# Patient Record
Sex: Female | Born: 1935 | Race: White | Hispanic: No
Health system: Southern US, Community
[De-identification: ages and names within clinical notes are randomized; demographics above are authoritative.]

---

## 2007-07-24 ENCOUNTER — Ambulatory Visit: Payer: Self-pay | Admitting: Internal Medicine

## 2008-03-12 ENCOUNTER — Emergency Department: Payer: Self-pay | Admitting: Emergency Medicine

## 2009-08-16 ENCOUNTER — Emergency Department: Payer: Self-pay | Admitting: Internal Medicine

## 2009-12-10 ENCOUNTER — Emergency Department: Payer: Self-pay | Admitting: Unknown Physician Specialty

## 2010-09-29 ENCOUNTER — Inpatient Hospital Stay: Payer: Self-pay | Admitting: Internal Medicine

## 2011-08-06 ENCOUNTER — Inpatient Hospital Stay: Payer: Self-pay | Admitting: Internal Medicine

## 2011-08-06 LAB — URINALYSIS, COMPLETE
Nitrite: NEGATIVE
Ph: 7 (ref 4.5–8.0)
Protein: 30
Specific Gravity: 1.011 (ref 1.003–1.030)
Squamous Epithelial: 1

## 2011-08-06 LAB — COMPREHENSIVE METABOLIC PANEL
Albumin: 3.5 g/dL (ref 3.4–5.0)
Anion Gap: 12 (ref 7–16)
Bilirubin,Total: 0.4 mg/dL (ref 0.2–1.0)
Calcium, Total: 9.2 mg/dL (ref 8.5–10.1)
Co2: 23 mmol/L (ref 21–32)
Glucose: 162 mg/dL — ABNORMAL HIGH (ref 65–99)
Osmolality: 289 (ref 275–301)
Potassium: 3.9 mmol/L (ref 3.5–5.1)

## 2011-08-06 LAB — CBC
HCT: 40 % (ref 35.0–47.0)
HGB: 13.2 g/dL (ref 12.0–16.0)
MCH: 31.1 pg (ref 26.0–34.0)
MCHC: 33 g/dL (ref 32.0–36.0)
MCV: 94 fL (ref 80–100)
Platelet: 365 10*3/uL (ref 150–440)
WBC: 10.4 10*3/uL (ref 3.6–11.0)

## 2011-08-06 LAB — TROPONIN I: Troponin-I: 0.03 ng/mL

## 2011-08-07 LAB — BASIC METABOLIC PANEL
Anion Gap: 12 (ref 7–16)
BUN: 14 mg/dL (ref 7–18)
Calcium, Total: 8.8 mg/dL (ref 8.5–10.1)
Chloride: 106 mmol/L (ref 98–107)
Co2: 27 mmol/L (ref 21–32)
EGFR (African American): >60
EGFR (Non-African Amer.): >60
Osmolality: 289 (ref 275–301)
Potassium: 3.8 mmol/L (ref 3.5–5.1)

## 2011-08-07 LAB — CBC WITH DIFFERENTIAL/PLATELET
Eosinophil %: 0.2 %
HGB: 12.9 g/dL (ref 12.0–16.0)
Lymphocyte %: 13.9 %
MCV: 94 fL (ref 80–100)
Monocyte %: 8.7 %
Neutrophil %: 77 %
Platelet: 310 10*3/uL (ref 150–440)
RBC: 4.13 10*6/uL (ref 3.80–5.20)
WBC: 13 10*3/uL — ABNORMAL HIGH (ref 3.6–11.0)

## 2011-08-07 LAB — MAGNESIUM: Magnesium: 1.9 mg/dL

## 2011-08-08 LAB — CBC WITH DIFFERENTIAL/PLATELET
Basophil %: 0.5 %
Eosinophil #: 0.1 10*3/uL (ref 0.0–0.7)
Eosinophil %: 0.8 %
HCT: 38.3 % (ref 35.0–47.0)
HGB: 12.8 g/dL (ref 12.0–16.0)
Lymphocyte #: 3 10*3/uL (ref 1.0–3.6)
Lymphocyte %: 24.7 %
MCHC: 33.4 g/dL (ref 32.0–36.0)
MCV: 93 fL (ref 80–100)
Monocyte %: 10.3 %
Neutrophil #: 7.7 10*3/uL — ABNORMAL HIGH (ref 1.4–6.5)
RBC: 4.14 10*6/uL (ref 3.80–5.20)
RDW: 13.5 % (ref 11.5–14.5)
WBC: 12 10*3/uL — ABNORMAL HIGH (ref 3.6–11.0)

## 2011-08-09 LAB — WBC: WBC: 9 10*3/uL (ref 3.6–11.0)

## 2011-08-09 LAB — HEMOGLOBIN A1C: Hemoglobin A1C: 5.7 % (ref 4.2–6.3)

## 2011-08-31 LAB — CULTURE, BLOOD (SINGLE)

## 2011-10-27 ENCOUNTER — Inpatient Hospital Stay: Payer: Self-pay | Admitting: Internal Medicine

## 2011-10-27 LAB — URINALYSIS, COMPLETE
Bilirubin,UR: NEGATIVE
Blood: NEGATIVE
Glucose,UR: NEGATIVE mg/dL (ref 0–75)
Ketone: NEGATIVE
Nitrite: NEGATIVE
Ph: 5 (ref 4.5–8.0)
Specific Gravity: 1.013 (ref 1.003–1.030)

## 2011-10-27 LAB — CBC
HGB: 12.2 g/dL (ref 12.0–16.0)
MCH: 30.4 pg (ref 26.0–34.0)
MCHC: 32.6 g/dL (ref 32.0–36.0)
MCV: 93 fL (ref 80–100)
Platelet: 339 10*3/uL (ref 150–440)
RBC: 4.03 10*6/uL (ref 3.80–5.20)
WBC: 8.1 10*3/uL (ref 3.6–11.0)

## 2011-10-27 LAB — COMPREHENSIVE METABOLIC PANEL
Albumin: 3.1 g/dL — ABNORMAL LOW (ref 3.4–5.0)
Alkaline Phosphatase: 90 U/L (ref 50–136)
Anion Gap: 10 (ref 7–16)
BUN: 19 mg/dL — ABNORMAL HIGH (ref 7–18)
Bilirubin,Total: 0.3 mg/dL (ref 0.2–1.0)
Calcium, Total: 8.6 mg/dL (ref 8.5–10.1)
Co2: 22 mmol/L (ref 21–32)
Creatinine: 0.81 mg/dL (ref 0.60–1.30)
EGFR (African American): >60
EGFR (Non-African Amer.): >60
Glucose: 123 mg/dL — ABNORMAL HIGH (ref 65–99)
Osmolality: 285 (ref 275–301)
Potassium: 4.2 mmol/L (ref 3.5–5.1)
SGPT (ALT): 12 U/L
Sodium: 141 mmol/L (ref 136–145)

## 2011-10-27 LAB — CK TOTAL AND CKMB (NOT AT ARMC): CK, Total: 69 U/L (ref 21–215)

## 2011-10-28 LAB — CBC WITH DIFFERENTIAL/PLATELET
Basophil #: 0 10*3/uL (ref 0.0–0.1)
Basophil %: 0.4 %
Eosinophil #: 0.1 10*3/uL (ref 0.0–0.7)
Eosinophil %: 0.8 %
HCT: 36.6 % (ref 35.0–47.0)
Lymphocyte #: 2.2 10*3/uL (ref 1.0–3.6)
Lymphocyte %: 23.3 %
MCH: 30.5 pg (ref 26.0–34.0)
MCHC: 32.7 g/dL (ref 32.0–36.0)
Monocyte #: 1.1 x10 3/mm — ABNORMAL HIGH (ref 0.2–0.9)
Monocyte %: 11.1 %
Neutrophil %: 64.4 %
Platelet: 311 10*3/uL (ref 150–440)
WBC: 9.5 10*3/uL (ref 3.6–11.0)

## 2011-10-28 LAB — BASIC METABOLIC PANEL
BUN: 15 mg/dL (ref 7–18)
Calcium, Total: 8.8 mg/dL (ref 8.5–10.1)
Co2: 27 mmol/L (ref 21–32)
EGFR (Non-African Amer.): >60
Potassium: 3.8 mmol/L (ref 3.5–5.1)

## 2011-10-29 LAB — URINE CULTURE

## 2011-10-31 LAB — CULTURE, BLOOD (SINGLE)

## 2011-10-31 LAB — CREATININE, SERUM
EGFR (African American): >60
EGFR (Non-African Amer.): 60 — ABNORMAL LOW

## 2011-11-05 LAB — CULTURE, BLOOD (SINGLE)

## 2011-12-19 ENCOUNTER — Inpatient Hospital Stay: Payer: Self-pay | Admitting: Internal Medicine

## 2011-12-19 LAB — COMPREHENSIVE METABOLIC PANEL
Alkaline Phosphatase: 102 U/L (ref 50–136)
Anion Gap: 8 (ref 7–16)
BUN: 16 mg/dL (ref 7–18)
Bilirubin,Total: 0.2 mg/dL (ref 0.2–1.0)
Calcium, Total: 8.8 mg/dL (ref 8.5–10.1)
Chloride: 109 mmol/L — ABNORMAL HIGH (ref 98–107)
Co2: 27 mmol/L (ref 21–32)
Creatinine: 0.92 mg/dL (ref 0.60–1.30)
EGFR (African American): >60
EGFR (Non-African Amer.): 58 — ABNORMAL LOW
Osmolality: 288 (ref 275–301)
SGOT(AST): 19 U/L (ref 15–37)
SGPT (ALT): 13 U/L
Sodium: 144 mmol/L (ref 136–145)
Total Protein: 7.4 g/dL (ref 6.4–8.2)

## 2011-12-19 LAB — URINALYSIS, COMPLETE
Glucose,UR: NEGATIVE mg/dL (ref 0–75)
Ketone: NEGATIVE
Nitrite: NEGATIVE
Ph: 6 (ref 4.5–8.0)
RBC,UR: 52 /HPF (ref 0–5)
Specific Gravity: 1.011 (ref 1.003–1.030)
Squamous Epithelial: NONE SEEN
WBC UR: 3636 /HPF (ref 0–5)

## 2011-12-19 LAB — CBC
HCT: 36 % (ref 35.0–47.0)
HGB: 11.6 g/dL — ABNORMAL LOW (ref 12.0–16.0)
MCV: 91 fL (ref 80–100)
Platelet: 432 10*3/uL (ref 150–440)

## 2011-12-20 LAB — CBC WITH DIFFERENTIAL/PLATELET
Basophil #: 0.1 x10 3/mm 3
Basophil %: 0.6 %
Eosinophil #: 0.1 x10 3/mm 3
Eosinophil %: 0.5 %
HCT: 36.7 %
HGB: 11.9 g/dL — ABNORMAL LOW
Lymphocyte %: 24.5 %
Lymphs Abs: 3.1 x10 3/mm 3
MCH: 29.4 pg
MCHC: 32.3 g/dL
MCV: 91 fL
Monocyte #: 1.3 "x10 3/mm " — ABNORMAL HIGH
Monocyte %: 10.1 %
Neutrophil #: 8.2 x10 3/mm 3 — ABNORMAL HIGH
Neutrophil %: 64.3 %
Platelet: 433 x10 3/mm 3
RBC: 4.04 X10 6/mm 3
RDW: 13.9 %
WBC: 12.8 x10 3/mm 3 — ABNORMAL HIGH

## 2011-12-20 LAB — BASIC METABOLIC PANEL
Anion Gap: 9 (ref 7–16)
BUN: 11 mg/dL (ref 7–18)
Chloride: 106 mmol/L (ref 98–107)
Co2: 27 mmol/L (ref 21–32)
EGFR (Non-African Amer.): >60
Glucose: 89 mg/dL (ref 65–99)
Osmolality: 282 (ref 275–301)
Potassium: 4 mmol/L (ref 3.5–5.1)
Sodium: 142 mmol/L (ref 136–145)

## 2011-12-21 LAB — BASIC METABOLIC PANEL
Anion Gap: 10 (ref 7–16)
BUN: 11 mg/dL (ref 7–18)
Co2: 26 mmol/L (ref 21–32)
Creatinine: 0.94 mg/dL (ref 0.60–1.30)
EGFR (African American): >60
Glucose: 92 mg/dL (ref 65–99)
Osmolality: 286 (ref 275–301)
Sodium: 144 mmol/L (ref 136–145)

## 2011-12-21 LAB — URINE CULTURE

## 2011-12-25 LAB — CULTURE, BLOOD (SINGLE)

## 2012-08-19 ENCOUNTER — Ambulatory Visit: Payer: Self-pay | Admitting: Internal Medicine

## 2012-09-09 ENCOUNTER — Inpatient Hospital Stay: Payer: Self-pay | Admitting: Internal Medicine

## 2012-09-09 LAB — CBC
HCT: 38.2 % (ref 35.0–47.0)
HGB: 12.6 g/dL (ref 12.0–16.0)
MCH: 30.4 pg (ref 26.0–34.0)
MCHC: 32.9 g/dL (ref 32.0–36.0)
MCV: 92 fL (ref 80–100)
RDW: 16.5 % — ABNORMAL HIGH (ref 11.5–14.5)
WBC: 15.8 10*3/uL — ABNORMAL HIGH (ref 3.6–11.0)

## 2012-09-09 LAB — COMPREHENSIVE METABOLIC PANEL
Albumin: 2.8 g/dL — ABNORMAL LOW (ref 3.4–5.0)
Anion Gap: 7 (ref 7–16)
BUN: 21 mg/dL — ABNORMAL HIGH (ref 7–18)
Bilirubin,Total: 0.4 mg/dL (ref 0.2–1.0)
EGFR (Non-African Amer.): 38 — ABNORMAL LOW
Osmolality: 297 (ref 275–301)
Potassium: 3.5 mmol/L (ref 3.5–5.1)
SGOT(AST): 24 U/L (ref 15–37)
Sodium: 146 mmol/L — ABNORMAL HIGH (ref 136–145)

## 2012-09-09 LAB — CK TOTAL AND CKMB (NOT AT ARMC)
CK, Total: 76 U/L (ref 21–215)
CK, Total: 140 U/L (ref 21–215)
CK-MB: 1.3 ng/mL (ref 0.5–3.6)

## 2012-09-09 LAB — URINALYSIS, COMPLETE
Bilirubin,UR: NEGATIVE
Glucose,UR: NEGATIVE mg/dL (ref 0–75)
Nitrite: NEGATIVE
RBC,UR: 70 /HPF (ref 0–5)
Specific Gravity: 1.018 (ref 1.003–1.030)

## 2012-09-09 LAB — TROPONIN I: Troponin-I: 0.12 ng/mL — ABNORMAL HIGH

## 2012-09-10 LAB — CBC WITH DIFFERENTIAL/PLATELET
Basophil #: 0 10*3/uL (ref 0.0–0.1)
Basophil %: 0.2 %
Eosinophil #: 0 10*3/uL (ref 0.0–0.7)
Eosinophil %: 0.1 %
MCH: 30.3 pg (ref 26.0–34.0)
MCHC: 32.4 g/dL (ref 32.0–36.0)
Monocyte #: 1.7 x10 3/mm — ABNORMAL HIGH (ref 0.2–0.9)
Neutrophil #: 13.3 10*3/uL — ABNORMAL HIGH (ref 1.4–6.5)
Neutrophil %: 80.3 %
Platelet: 212 10*3/uL (ref 150–440)

## 2012-09-10 LAB — FERRITIN: Ferritin (ARMC): 113 ng/mL (ref 8–388)

## 2012-09-10 LAB — BASIC METABOLIC PANEL
Anion Gap: 8 (ref 7–16)
Calcium, Total: 7.6 mg/dL — ABNORMAL LOW (ref 8.5–10.1)
EGFR (African American): >60
Osmolality: 304 (ref 275–301)
Potassium: 3.1 mmol/L — ABNORMAL LOW (ref 3.5–5.1)
Sodium: 151 mmol/L — ABNORMAL HIGH (ref 136–145)

## 2012-09-10 LAB — IRON AND TIBC
Iron Saturation: 7 %
Iron: 13 ug/dL — ABNORMAL LOW (ref 50–170)

## 2012-09-10 LAB — CK TOTAL AND CKMB (NOT AT ARMC)
CK, Total: 121 U/L (ref 21–215)
CK-MB: 1.4 ng/mL (ref 0.5–3.6)

## 2012-09-10 LAB — TROPONIN I: Troponin-I: 0.09 ng/mL — ABNORMAL HIGH

## 2012-09-11 LAB — CBC WITH DIFFERENTIAL/PLATELET
Basophil #: 0 10*3/uL (ref 0.0–0.1)
Basophil %: 0 %
Eosinophil #: 0 10*3/uL (ref 0.0–0.7)
HCT: 29.5 % — ABNORMAL LOW (ref 35.0–47.0)
HGB: 9.3 g/dL — ABNORMAL LOW (ref 12.0–16.0)
Lymphocyte #: 1.1 10*3/uL (ref 1.0–3.6)
Lymphocyte %: 5.8 %
MCH: 29.8 pg (ref 26.0–34.0)
MCHC: 31.7 g/dL — ABNORMAL LOW (ref 32.0–36.0)
MCV: 94 fL (ref 80–100)
Monocyte #: 0.7 x10 3/mm (ref 0.2–0.9)
Monocyte %: 3.6 %
Neutrophil #: 17 10*3/uL — ABNORMAL HIGH (ref 1.4–6.5)
Platelet: 254 10*3/uL (ref 150–440)
RBC: 3.13 10*6/uL — ABNORMAL LOW (ref 3.80–5.20)
RDW: 16.2 % — ABNORMAL HIGH (ref 11.5–14.5)

## 2012-09-11 LAB — BASIC METABOLIC PANEL
BUN: 14 mg/dL (ref 7–18)
Calcium, Total: 8 mg/dL — ABNORMAL LOW (ref 8.5–10.1)
Creatinine: 0.78 mg/dL (ref 0.60–1.30)
EGFR (African American): >60
EGFR (Non-African Amer.): >60
Potassium: 4.2 mmol/L (ref 3.5–5.1)
Sodium: 142 mmol/L (ref 136–145)

## 2012-09-11 LAB — CLOSTRIDIUM DIFFICILE BY PCR

## 2012-09-11 LAB — OCCULT BLOOD X 1 CARD TO LAB, STOOL: Occult Blood, Feces: POSITIVE

## 2012-09-12 LAB — BASIC METABOLIC PANEL
Anion Gap: 6 — ABNORMAL LOW (ref 7–16)
Chloride: 118 mmol/L — ABNORMAL HIGH (ref 98–107)
Creatinine: 0.68 mg/dL (ref 0.60–1.30)
Glucose: 151 mg/dL — ABNORMAL HIGH (ref 65–99)
Osmolality: 289 (ref 275–301)
Sodium: 144 mmol/L (ref 136–145)

## 2012-09-12 LAB — CBC WITH DIFFERENTIAL/PLATELET
Basophil #: 0.2 10*3/uL — ABNORMAL HIGH (ref 0.0–0.1)
Eosinophil #: 0 10*3/uL (ref 0.0–0.7)
HGB: 10.5 g/dL — ABNORMAL LOW (ref 12.0–16.0)
MCHC: 31.9 g/dL — ABNORMAL LOW (ref 32.0–36.0)
MCV: 94 fL (ref 80–100)
Monocyte %: 5.7 %
RBC: 3.49 10*6/uL — ABNORMAL LOW (ref 3.80–5.20)
WBC: 12.9 10*3/uL — ABNORMAL HIGH (ref 3.6–11.0)

## 2012-09-13 LAB — CBC WITH DIFFERENTIAL/PLATELET
Basophil #: 0 10*3/uL (ref 0.0–0.1)
Basophil %: 0.2 %
Eosinophil #: 0 10*3/uL (ref 0.0–0.7)
HCT: 31.3 % — ABNORMAL LOW (ref 35.0–47.0)
Lymphocyte #: 1 10*3/uL (ref 1.0–3.6)
MCH: 30.5 pg (ref 26.0–34.0)
MCV: 94 fL (ref 80–100)
Monocyte #: 0.5 x10 3/mm (ref 0.2–0.9)
Monocyte %: 5.5 %
Platelet: 256 10*3/uL (ref 150–440)
WBC: 9.1 10*3/uL (ref 3.6–11.0)

## 2012-09-13 LAB — BASIC METABOLIC PANEL
Anion Gap: 9 (ref 7–16)
BUN: 11 mg/dL (ref 7–18)
Calcium, Total: 8.2 mg/dL — ABNORMAL LOW (ref 8.5–10.1)
Chloride: 117 mmol/L — ABNORMAL HIGH (ref 98–107)
Creatinine: 0.61 mg/dL (ref 0.60–1.30)
EGFR (African American): >60
Glucose: 95 mg/dL (ref 65–99)
Potassium: 4.4 mmol/L (ref 3.5–5.1)
Sodium: 145 mmol/L (ref 136–145)

## 2012-09-14 LAB — CULTURE, BLOOD (SINGLE)

## 2012-09-16 ENCOUNTER — Ambulatory Visit: Payer: Self-pay | Admitting: Internal Medicine

## 2012-10-17 DEATH — deceased

## 2013-05-15 IMAGING — CT CT HEAD WITHOUT CONTRAST
2 series · 16 of 30 positions shown, 20 images · non-contrast
Comparison: none

REASON FOR EXAM: seizure, ams
COMMENTS:

PROCEDURE:     CT  - CT HEAD WITHOUT CONTRAST  - August 06, 2011 [DATE]
RESULT:     Comparison:  09/29/2010
TECHNIQUE: Multiple axial images from the foramen magnum to the vertex were
obtained without IV contrast.

[Series 2: without · axial · non-contrast · 0.39mm/px · z∈[-181,-56]mm · 13 of 31 slices shown, 17 images]
[im 3/31  brain]
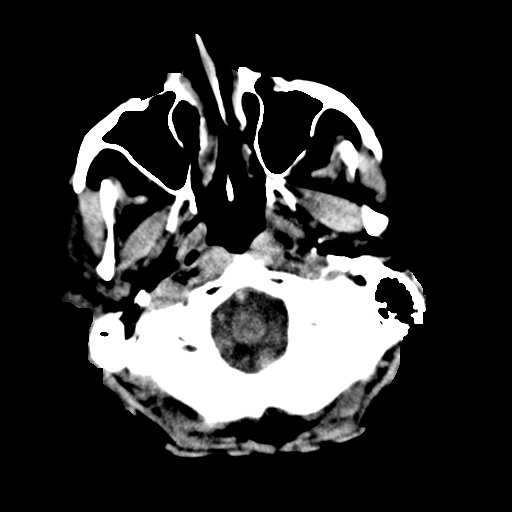
[im 3/31  bone]
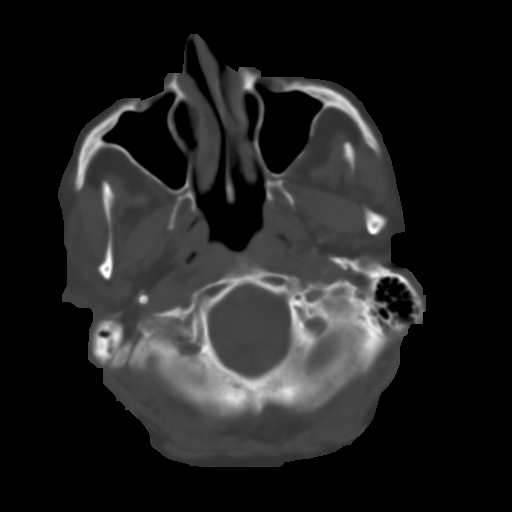
[im 5/31  brain]
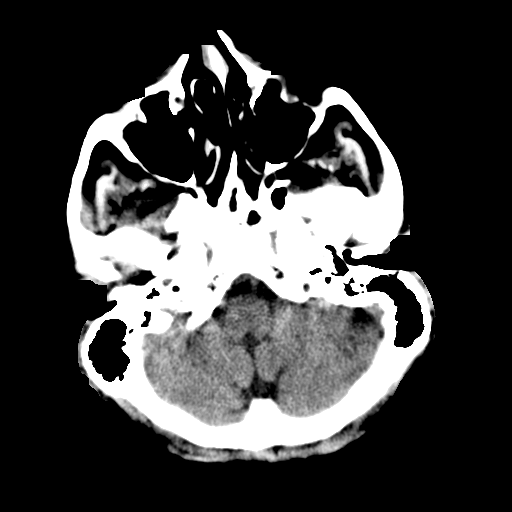
[im 7/31  brain]
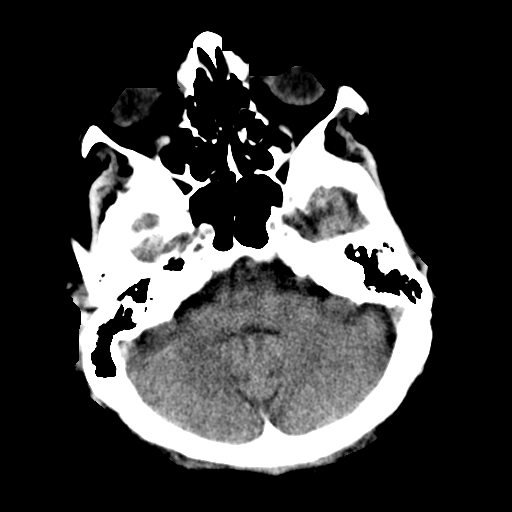
[im 9/31  brain]
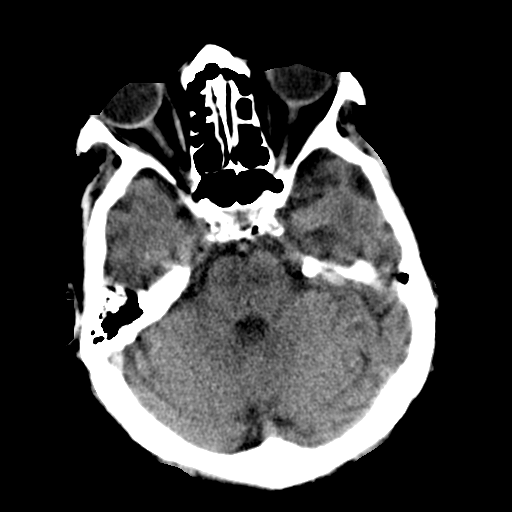
[im 11/31  brain]
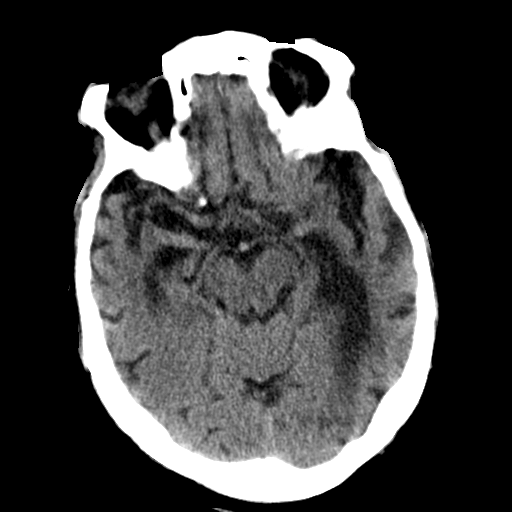
[im 11/31  bone]
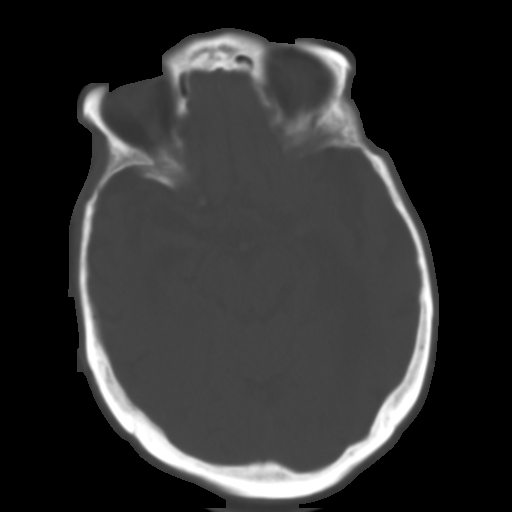
[im 13/31  brain]
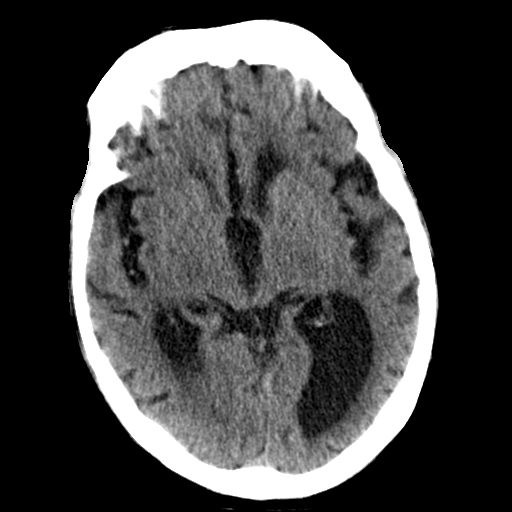
[im 16/31  brain]
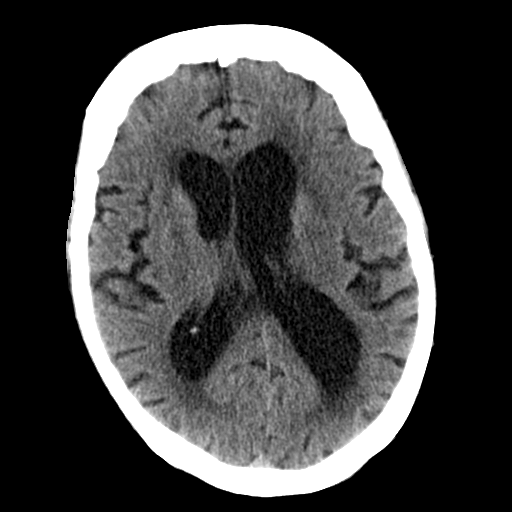
[im 18/31  brain]
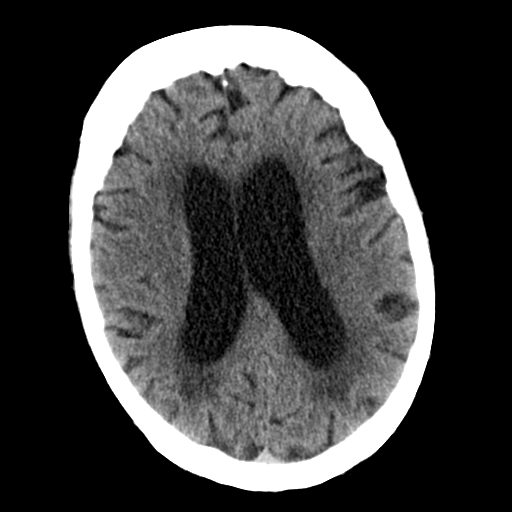
[im 20/31  brain]
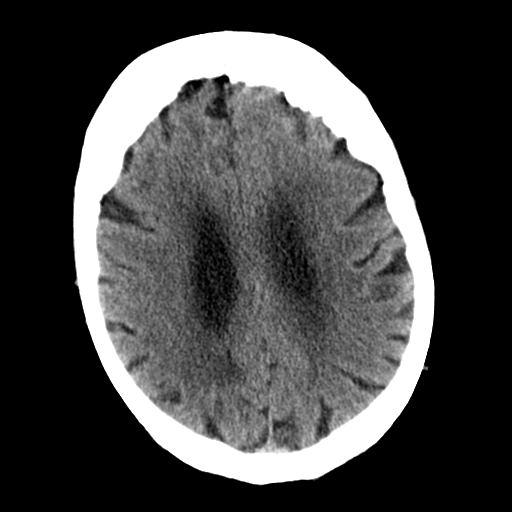
[im 20/31  bone]
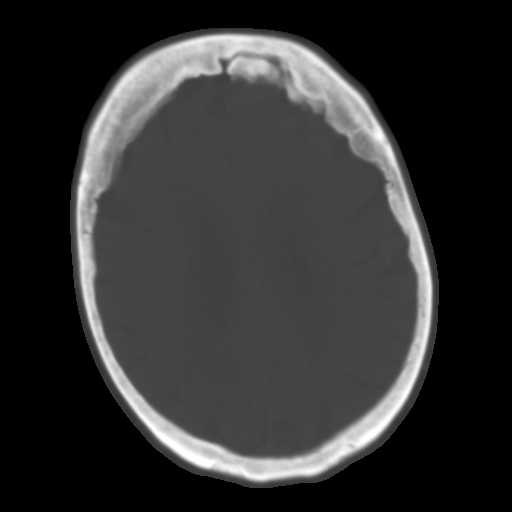
[im 22/31  brain]
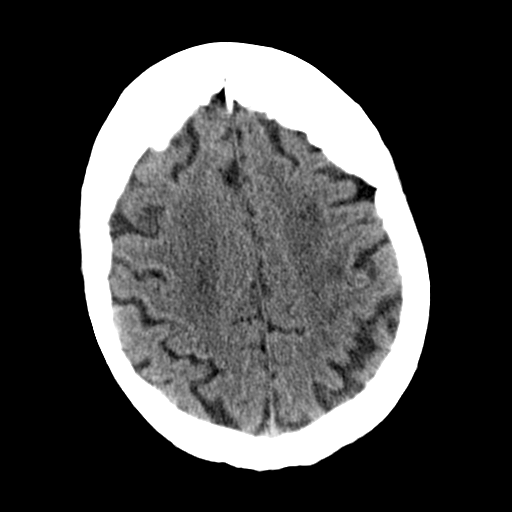
[im 24/31  brain]
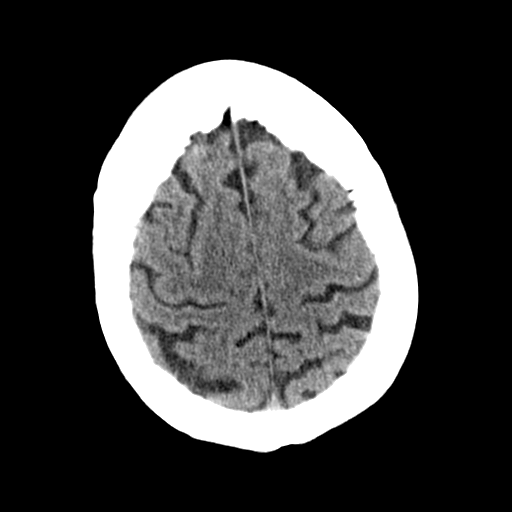
[im 26/31  brain]
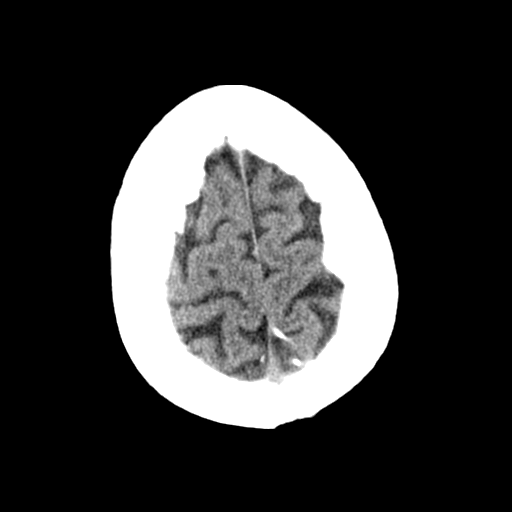
[im 28/31  brain]
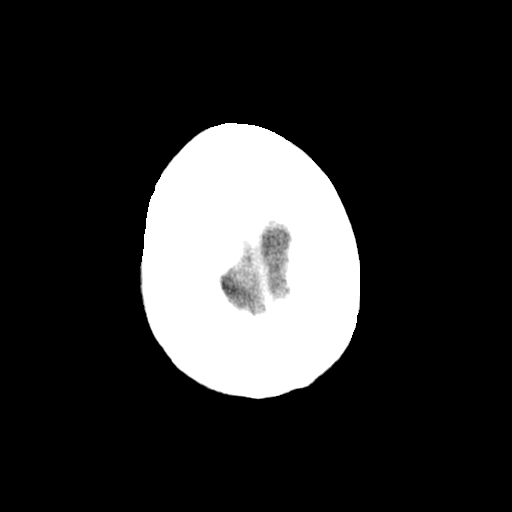
[im 28/31  bone]
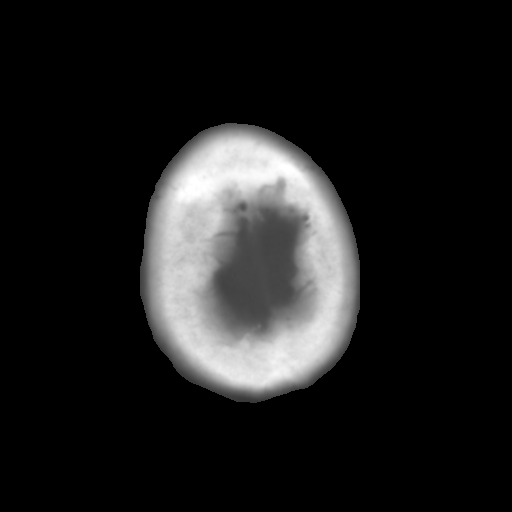

[Series 3: bone · axial · 0.39mm/px · z∈[-181,-141]mm · 3 of 31 slices shown]
[im 3/31  bone]
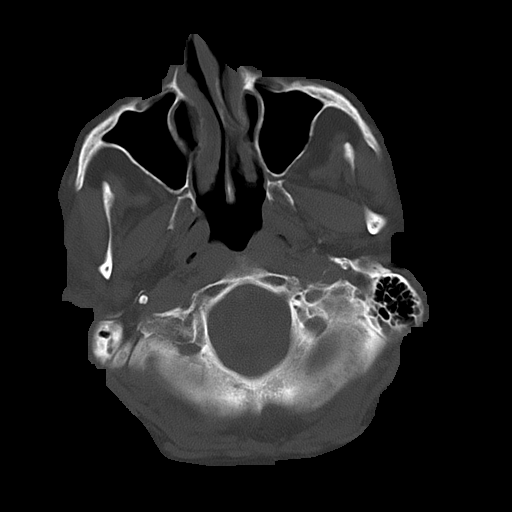
[im 7/31  bone]
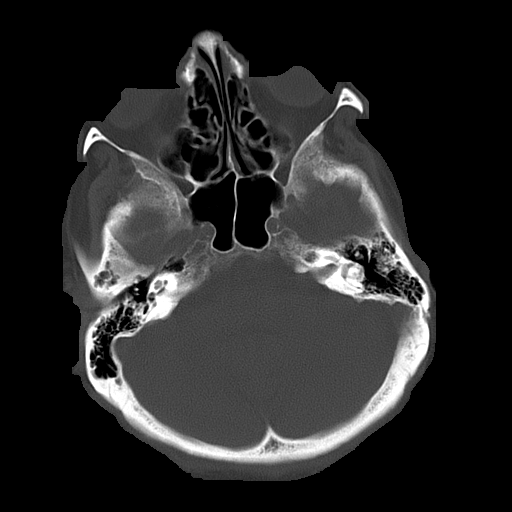
[im 11/31  bone]
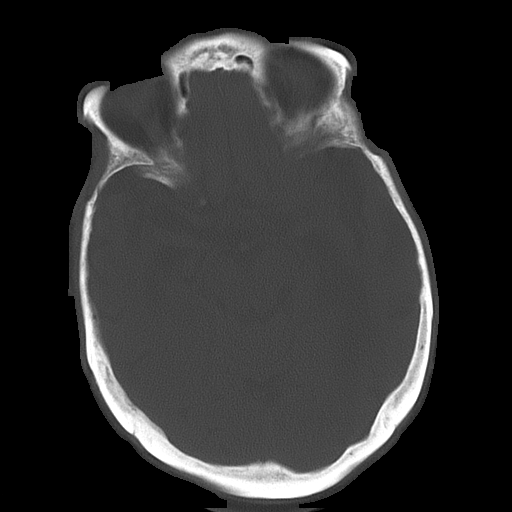

[16 of 30 positions shown; findings below may reference images not displayed]

FINDINGS: There is no evidence for mass effect, midline shift, or extra-axial fluid
collections. There is no evidence for space-occupying lesion, intracranial
hemorrhage, or cortical-based area of infarction. Periventricular and
subcortical hypoattenuation is consistent with chronic small vessel ischemic
disease. There is mild cerebral atrophy and ex vacuo dilatation of the
lateral ventricles.

The osseous structures are unremarkable.
IMPRESSION: 1. No acute intracranial process.
2. Chronic small vessel ischemic disease.

## 2014-06-19 IMAGING — CR DG CHEST 1V PORT
1 series · 1 of 1 positions shown · non-contrast
Comparison: none

REASON FOR EXAM: sepsis
COMMENTS:

[portable]
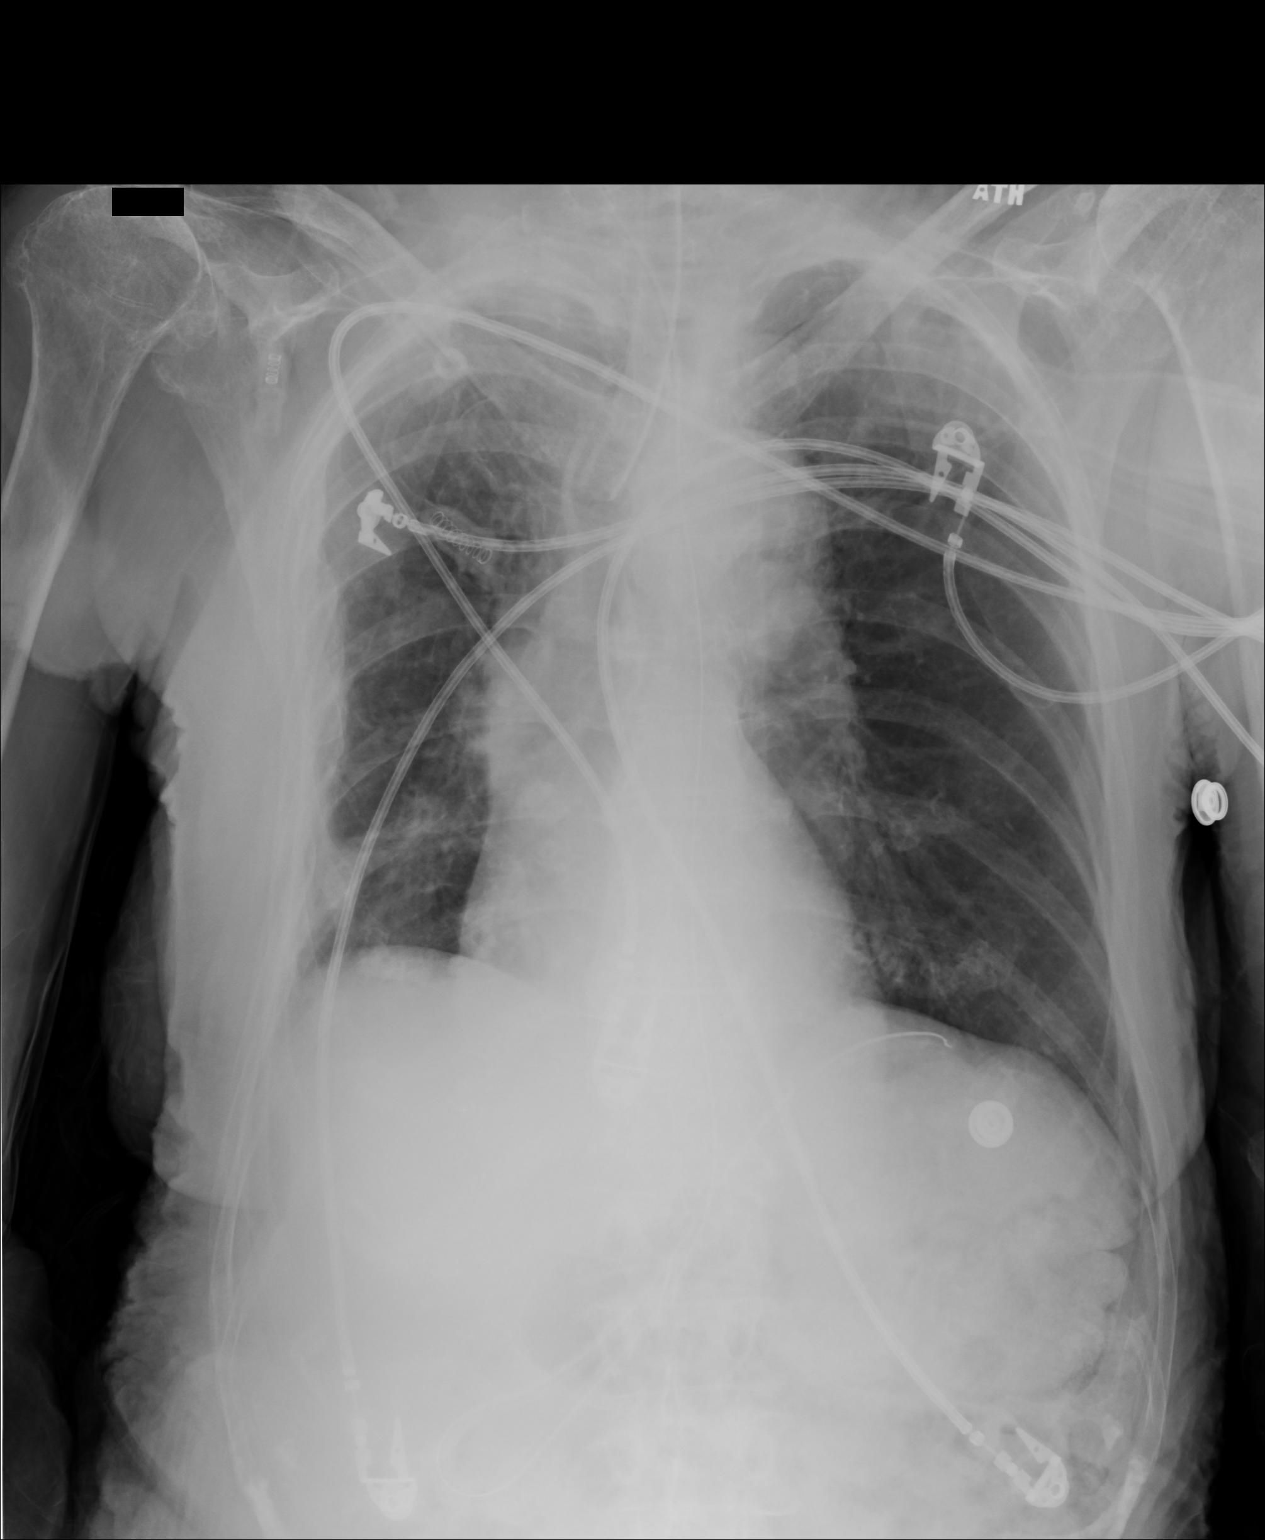

[1 of 1 positions shown; findings below may reference images not displayed]

PROCEDURE:     DXR - DXR PORTABLE CHEST SINGLE VIEW  - September 09, 2012 [DATE]

RESULT:     Comparison is made to the study October 27, 2011.

The endotracheal tube tip lies at the level of the inferior margin of the
clavicular heads approximately 2.5 cm above the carina. The left lung is
clear. On the right there is a small amount of pleural thickening laterally
but this is accentuated by the patient's rotation. There are coarse lung
markings in the lower lung on the right. The cardiac silhouette is normal in
size. The pulmonary vascularity is not engorged.
IMPRESSION: The endotracheal tube tip is in reasonable position. There
are coarse interstitial markings some of which are confluent in the right
lung. These may reflect atelectasis or pneumonia. Followup films are
recommended.

[REDACTED]

## 2014-06-23 IMAGING — CR DG ABDOMEN 1V
1 series · 1 of 1 positions shown · non-contrast
Comparison: none

REASON FOR EXAM: abdominal distention, constipation
COMMENTS:   Bedside (portable):Y

[supine kub]
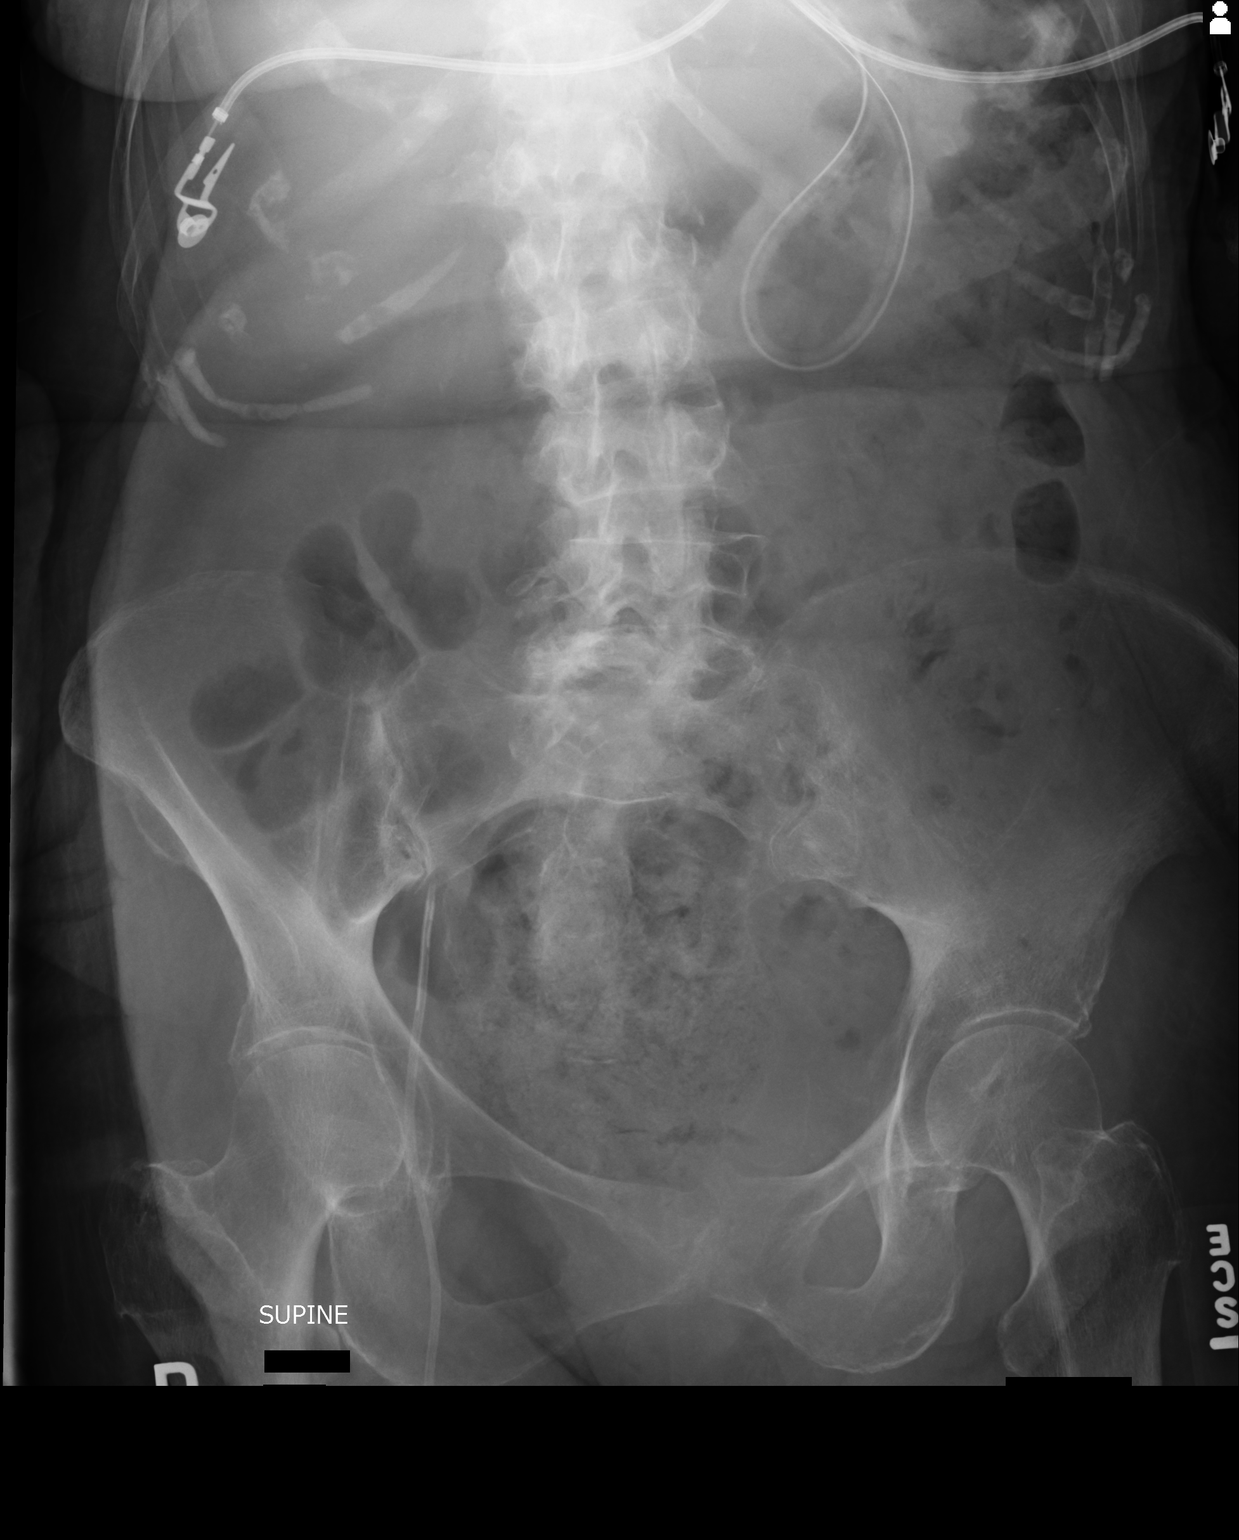

[1 of 1 positions shown; findings below may reference images not displayed]

PROCEDURE:     DXR - DXR KIDNEY URETER BLADDER  - September 13, 2012  [DATE]

RESULT:     There is a right femoral line present. Nasogastric tube coils in
the stomach. The bowel gas pattern appears unremarkable. Degenerative
changes are seen in the spine with facet hypertrophy especially on the
right. Sacroiliac degenerative changes are present. Cardiac monitoring
electrodes are present. There is no abnormal bowel distention.
IMPRESSION: We see above.

[REDACTED]

## 2014-11-08 NOTE — Consult Note (Signed)
   Comments   Spoke with pt's son. Updated him. He is disheartened that pt has not had improvement in mental status and understands that this may herald a decline. We talked about nutrition. Also discussed pt's coarse UAW secretions and concern for aspiration. We discussed transfer out of the CCU today and appropriateness for the Hospice Home vs home with hospice. Son wants to speak with his sister today at lunch at will let us know what family is thinking.   Electronic Signatures for Addendum Section:  Phifer, Izora Gala (MD) (Signed Addendum 27-Feb-14 13:29)  Met with son. Discussed with Billey Chang, NP, in detail. Agree with assessment and plan as outlined in above note.   Electronic Signatures: Borders, Kirt Boys (NP)  (Signed 27-Feb-14 09:27)  Authored: Palliative Care   Last Updated: 27-Feb-14 13:29 by Phifer, Izora Gala (MD)

## 2014-11-08 NOTE — Discharge Summary (Signed)
PATIENT NAME:  Kelly Gomez, Kelly Gomez MR#:  [id number] DATE OF BIRTH:  1929-04-30  DATE OF ADMISSION:  09/09/2012 DATE OF DISCHARGE:  09/16/2012  ADMITTING PHYSICIAN: Dr. Elpidio AnisSudini.  DISCHARGING PHYSICIAN:  Dr. Enid Baasadhika Felicidad Gomez.  CONSULTATIONS IN THE HOSPITAL: 1.  Pulmonary critical care consultation by Dr. Belia HemanKasa.  2.  Palliative care consultation by Dr. Harriett SineNancy Phifer.   DISCHARGE DIAGNOSES: 1.  Sepsis.  2.  Urinary tract infection.  3.  Metabolic encephalopathy.  4.  Dementia.  5.  Acute respiratory failure.  6.  Pneumonia.  7.  Anemia.   DISCHARGE MEDICATIONS: 1.  Roxanol 20 mg/mL oral concentrate - 0.25 mL every 1 to 2 hours p.r.n. for pain or dyspnea.  2.  Scopolamine 1 patch every 3 days.  3.  Ativan 0.5 mg 1 tablet every 3 to 4 hours as needed for anxiety.  4.  Ranitidine 150 mg orally b.i.d.  5.  Keppra 500 mg p.o. b.i.d.   DISCHARGE DIET: As tolerated.   ACTIVITY: As tolerated.  FOLLOWUP INSTRUCTIONS: The patient is being transferred to hospice home.   LABORATORY AND DIAGNOSTIC DATA:  Sodium 145, potassium 4.4, chloride 117, bicarbonate 19, BUN 11, creatinine 0.61, glucose 95. WBC is 9.1, hemoglobin 10.2, hematocrit 31.3, platelet count 256. Troponin is mildly elevated at 0.12.  Blood cultures have remained negative. Urine cultures growing greater than 100,000 colonies of staph epidermidis and enterococcus faecalis. Urinalysis on admission was positive for infection CT of the head showing no evidence of acute ischemic or hemorrhagic infarction, moderately diffuse cerebellar atrophy is present consistent with a chronic small vessel ischemic changes. Chest x-ray showing coarse interstitial markings especially in the right lung, could reflect atelectasis or pneumonia.   BRIEF HOSPITAL COURSE:  The patient is an 81 year old female in with past medical history significant for dementia, seizures who is not very functional at home who was brought in secondary to change in mental status.  Also was found to be tachypneic and intubated for impending respiratory failure.   1.  Sepsis secondary to urinary tract infection and also pneumonia.  She was admitted, intubated for respiratory failure. Blood cultures were negative. Urine cultures were growing E. coli. The patient was treated with antibiotics while in the hospital. She did finish her course of antibiotics here and she was put on spontaneous mode of breathing, but neurologically she did not recover in spite of taking her off sedation. She was extubated to nasal cannula 09/13/2012.  Since then the patient was only opening her eyes to sternal rub with some incoherent words.  She did have increased upper airway secretions and possible risk of aspiration so speech pathologist did not clear her for eating yet.  She was placed on scopolamine patch, which improvement secretions. After the palliative care team talked with the patient's son and daughter, they have finally decided to make her DO NOT RESUSCITATE. They agreed to transfer to hospice home which is appropriate in her clinical situation.   DISCHARGE CONDITION: Guarded with poor prognosis.   DISCHARGE DISPOSITION: Hospice home.   Time spent on discharge: 40 minutes     ____________________________ Kelly Baasadhika Darrold Bezek, MD rk:ct D: 09/19/2012 17:10:11 ET T: 09/19/2012 19:17:56 ET JOB#: [id number]  cc: Kelly Baasadhika Kyndall Amero, MD, <Dictator> Kelly BaasADHIKA Cyndy Braver MD ELECTRONICALLY SIGNED 09/20/2012 13:35

## 2014-11-08 NOTE — H&P (Signed)
PATIENT NAME:  Kelly Gomez, Kelly Gomez MR#:  [id number] DATE OF BIRTH:  05/08/1929  DATE OF ADMISSION:  09/09/2012   PRIMARY CARE PHYSICIAN:  None. The patient used to see Dr. Dan Humphreys in the past but at present he does not have a physician.   CHIEF COMPLAINT:  Unresponsiveness.   HISTORY OF PRESENT ILLNESS: An 81 year old female patient with history of recurrent UTIs, dementia, seizure, presents to the hospital from home with unresponsiveness. The patient was found to be tachypneic, not responding to painful stimuli, was brought to the ER here. The patient has been found to have urinary tract infection with pyuria, elevated lactic acid, hypotensive in the systolic of 70s intubated, has a central line and is being admitted to the hospitalist service for further work-up and treatment. The patient is critically ill with sepsis, vent-dependent respiratory failure.   PAST MEDICAL HISTORY:  Dementia, seizures, recurrent UTIs.   HOME MEDICATIONS:  None.   ALLERGIES:  PENICILLIN.   SOCIAL HISTORY: The patient lives with daughter and son. Ambulates with a walker. Has significant dementia. Does not smoke. No alcohol.   FAMILY HISTORY: Reviewed, unknown.   REVIEW OF SYSTEMS:  Unobtainable as the patient is intubated.   PHYSICAL EXAMINATION: VITAL SIGNS: Shows temperature 99, pulse of 112, blood pressure 75/46.  GENERAL:  Elderly Caucasian female patient lying in bed unresponsive, intubated.  HEENT:  Atraumatic, normocephalic. Oral mucosa dry and pink. Pupils bilaterally equal and reactive to light.   NECK:  Supple. No thyromegaly. No palpable lymph nodes. Trachea midline. No carotid bruit or JVD.  CARDIOVASCULAR:  S1, S2, no edema.  RESPIRATORY: Clear to auscultation on both sides.  GASTROINTESTINAL: Soft abdomen, nontender. Bowel sounds present.  GENITOURINARY:  No suprapubic tenderness or bladder distention. Has Foley catheter in place. No urine.  MUSCULOSKELETAL: No joint swelling, redness, effusion  of the large joints. Normal muscle tone.  NEUROLOGICAL: Does not move extremities. No withdrawal to pain.  LABORATORY STUDIES: Show glucose of 166, BUN 21, creatinine 1.32, sodium 146, potassium 3.5, chloride 112, CK of 76, troponin 0.08. WBC 15.8, hemoglobin 12.6, platelets of 367. Urinalysis shows 1300, WBCs 2+ bacteria. ABG shows pH of 7.46. PCO2 37, pO2 461, lactic acid of 2.4.   CT of the head shows atrophy. No acute abnormalities.   Chest x-ray portable shows chronic interstitial changes. Right-sided infiltrate versus atelectasis.   ASSESSMENT AND PLAN: 1.  Sepsis secondary to urinary tract infection with acute encephalopathy. The patient will be started on broad-spectrum antibiotics reviewing old cultures. The patient has had Escherichia coli, klebsiella in the past which have been pansensitive. Will taper antibiotics quickly, await for cultures at this time, aggressive IV fluid resuscitation considering the hypotension. The patient's blood pressure seems to be improving at this time. Will need pressors in case the map is less than 65 in spite of aggressive IV fluid hydration.  2.  Independent respiratory failure. This was secondary to acute encephalopathy and tachypnea. The patient does seem to have right-sided infiltrate. Will be on antibiotics. Will wait for cultures. Consult pulmonology for further input with the case.  3.  Acute renal failure. This is secondary to the sepsis, IV fluid resuscitation. Repeat labs in the morning.  4. Elevated troponin secondary to likely demand ischemia. Check 2 more sets of enzymes. 5.  Deep venous thrombosis prophylaxis with heparin.   CODE STATUS:  FULL CODE as discussed with patient's daughter.   Time spent today on this critically ill case, being admitted to Critical Care Unit  on the ventilator and with sepsis was 60 minutes.    ____________________________ Molinda BailiffSrikar R. Caleb Decock, MD srs:ce D: 09/09/2012 13:46:32 ET T: 09/09/2012 16:34:10  ET JOB#: [id number]  cc: Wardell HeathSrikar R. Eupha Lobb, MD, <Dictator> Orie FishermanSRIKAR R Sabine Tenenbaum MD ELECTRONICALLY SIGNED 09/16/2012 15:24

## 2014-11-08 NOTE — Consult Note (Signed)
   Comments   Met with pt's son. Updated him on pt's medical status. He says that family realizes how sick patient is and that she may not survive this hospitalization. However they remain hopeful that pt will improve and they can take her home. At baseline, pt has advanced dementia (FAST 7C). She is no longer able to ambulate by self but can take a few steps with 2-person assisstance, she is able to feed herself at times but is nonverbal. We discussed options of continued ventilator (ie. tracheostomy if vent dependent) and son says that family would be very unlikely to opt for this. We also talked about comfort care if pt is unable to be weaned from the vent or fails extubation.  actually would have qualified for hospice care prior to this event. Discussed this as a possible option with pt's son if pt improves.  code status at length. Son says that he has spoken with his sister and both agree with DNR. Will change code status to reflect this. 20 minutes  Electronic Signatures: Borders, Kirt Boys (NP)  (Signed 24-Feb-14 11:26)  Authored: Palliative Care Phifer, Izora Gala (MD)  (Signed 25-Feb-14 07:28)  Authored: Palliative Care   Last Updated: 25-Feb-14 07:28 by Phifer, Izora Gala (MD)
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
## 2014-11-10 NOTE — Discharge Summary (Signed)
Kelly Gomez NAME:  Kelly Gomez, Kelly Gomez MR#:  [id number] DATE OF BIRTH:  11-21-1928  DATE OF ADMISSION:  08/06/2011 DATE OF DISCHARGE:  08/09/2011  ADMITTING PHYSICIAN: Dr. Darrick Meigs  DISCHARGING PHYSICIAN: Dr. Enid Baas  PRIMARY CARE PHYSICIAN: None. She used to see Dr. Yates Decamp a long time ago but currently none.   CONSULTATIONS IN THE HOSPITAL: None.   DISCHARGE DIAGNOSES:  1. Escherichia coli urinary tract infection pansensitive to all antibiotics.  2. Seizure-like activity secondary to metabolic encephalopathy from urinary tract infection ON presentation which resolved.  3. Metabolic encephalopathy, improved.  4. Dementia.  5. Leukocytosis, resolved.  DISCHARGE HOME MEDICATIONS: Cefdinir oral suspension 300 mg p.o. q.12 hours until 08/13/2011.   DISCHARGE DIET: Mechanical soft diet.   DISCHARGE ACTIVITY: As tolerated.    FOLLOW UP INSTRUCTIONS: Advised to follow up with PCP in one week. An appointment has been scheduled for 08/17/2011.  LABORATORY, DIAGNOSTIC AND RADIOLOGICAL DATA: Labs at the time of discharge: WBC 12.0, hemoglobin 12.8, hematocrit 38.3, platelet count 278.   Sodium, 145, potassium 3.8, chloride 106, bicarbonate 27, BUN 14, creatinine 0.76, glucose 89, calcium 8.8. Blood cultures have remained negative while in the hospital. WBC count actually has improved to 9.0 on 08/09/2011. Hemoglobin A1c 5.7. Magnesium 1.9. Her CT of the head done without contrast showing no acute intracranial process. Chronic small vessel ischemic disease present and there is mild cerebral atrophy and ex vacuo dilatation of the lateral ventricles.   Chest x-ray showing no focal parenchymal opacity or acute disease of the chest on admission. Blood cultures initially growing coagulase-negative Staphylococcus. Repeat blood cultures are negative, probably contaminant. Urine culture is growing more than 100,000 colonies of Escherichia coli which was sensitive to all the antibiotics.    BRIEF HOSPITAL COURSE: Kelly Gomez is an 81 year old elderly female with history of dementia who is essentially bedbound and wheelchair bound at baseline, lives at home with her daughter and son as primary caregivers, was brought into the hospital secondary to change in mental status from baseline and possible seizure-like activity. According to Kelly Gomez's caregivers, Kelly Gomez had a similar episode in March 2012 with similar presentation. At that time the whole seizure workup was done including MRI, EEG, neuro consult and lumbar puncture but her seizure-like activity was related secondary to metabolic encephalopathy and urinary tract infection. Even this time her urine cultures were growing Escherichia coli.   Escherichia coli urinary tract infection causing metabolic encephalopathy and seizure-like activity. Her blood cultures initially growing coagulase-negative, likely contaminant. Repeat cultures are negative. Urine culture is growing Escherichia coli. She is being treated with Levaquin while in the hospital and is being changed to cefdinir oral suspension because Kelly Gomez has trouble swallowing pills and family requests to give syrup and Escherichia coli is sensitive to all cephalosporins so she is being discharged on Omnicef though it is not the first choice. Her mental status has remained at baseline. She is pleasantly confused secondary to dementia. Answers questions "yes" or "no". Her appetite has been very poor while in the hospital but she does eat better when family is around. She did have Foley catheter and that is being removed prior to discharge. She does not take any other medications at home so no new medications were added. Her vitals have remained stable and she has been afebrile while in the hospital. Her course has been otherwise uneventful. Discharge plan was discussed with daughter over the phone and they agreed to take the Kelly Gomez back home.   DISCHARGE CONDITION: Stable.  DISCHARGE  DISPOSITION: Home with primary caregivers.       TIME SPENT ON DISCHARGE: 40 minutes.  ____________________________ Enid Baasadhika Zyir Gassert, MD rk:cms D: 08/09/2011 16:13:21 ET T: 08/11/2011 08:27:00 ET JOB#: [id number]  cc: Enid Baasadhika Hailee Hollick, MD, <Dictator> John B. Danne HarborWalker III, MD Enid BaasADHIKA Hurley Blevins MD ELECTRONICALLY SIGNED 08/13/2011 14:46

## 2014-11-10 NOTE — Discharge Summary (Signed)
PATIENT NAME:  Kelly Gomez, Kelly Gomez MR#:  [id number] DATE OF BIRTH:  October 03, 1928  DATE OF ADMISSION:  12/19/2011 DATE OF DISCHARGE:  12/22/2011  PRIMARY CARE PHYSICIAN:  None.  FINAL DIAGNOSES:  1. Encephalopathy, which improved.  2. Seizure disorder.  3. Urinary tract infection.  4. Dementia, advanced.  CODE STATUS: The patient is a DO NOT RESUSCITATE.   DISCHARGE MEDICATIONS:  1. Keppra 100 mg/mL, 5 mL orally every 12 hours.  2. Tylenol 650 mg every four hours as needed for pain or temperature.  3. Nystatin 100,000 units/g topical powder, one application twice a day under skin folds.  4. Cipro 500 mg every 12 hours. Stop on 12/27/2011.   ACTIVITY: As tolerated.   REFERRAL: Physical therapy.   FOLLOWUP: Follow up in 1 to 2 days with doctor at rehab.  REASON FOR ADMISSION:  The patient was admitted 12/19/2011, discharged 12/22/2011.  She came in with encephalopathy and was found to have a urinary tract infection. The patient was admitted with metabolic encephalopathy, urinary tract infection, was started initially on oral medications then had a seizure and then had to be switched to IV medications for both seizure and urinary tract infection.   LABORATORY AND RADIOLOGICAL DATA: Urinalysis grew out Klebsiella pneumoniae greater than 100,000. White blood cell count of 9.4, hemoglobin and hematocrit 11.6 and 36, platelet count 432, glucose 104, BUN 16, creatinine 0.92, sodium 144, potassium 4.4, chloride 109, CO2 27, calcium 8.8. Liver function tests normal. Urinalysis: 3+ bacteria, 3+ leukocyte esterase. Blood cultures are negative. MRI of the brain with and without contrast showed no acute abnormality, diffuse atrophy, and white matter changes of chronic ischemia. The patient also had an EEG that showed diffuse slowing.  HOSPITAL COURSE PER PROBLEM LIST:  1. Encephalopathy, that had improved with time: The patient was seen in consultation by Dr. Kemper Durielarke of neurology who believed that she was  postictal for a prolonged period of time and also being complicated by urinary tract infection and underlying dementia. This had improved as per family to the patient's baseline. The patient is stable for discharge to rehab at this point.  2. Urinary tract infection: Initially the patient was started on oral antibiotics, had to be switched to IV being initially unresponsive. Will complete the course of Cipro, ending on 12/27/2011.  3. Seizure:  The patient was seen in consultation by Dr. Kemper Durielarke who recommended Keppra, and that will be continued upon discharge.   DISPOSITION: The patient will be discharged to rehab.   DIET: Puree with thin liquids.   ACTIVITY: As tolerated.   CODE STATUS: The patient is a DO NOT RESUSCITATE.   TIME SPENT ON DISCHARGE: 40 minutes.    ____________________________ Herschell Dimesichard J. Renae GlossWieting, MD rjw:bjt D: 12/22/2011 10:11:57 ET T: 12/22/2011 10:35:00 ET JOB#: [id number]  cc: Herschell Dimesichard J. Renae GlossWieting, MD, <Dictator> Salley ScarletICHARD J Vyctoria Dickman MD ELECTRONICALLY SIGNED 12/23/2011 17:26
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
## 2014-11-10 NOTE — Consult Note (Signed)
PATIENT NAME:  Kelly Gomez, Kelly Gomez MR#:  [id number] DATE OF BIRTH:  05/04/1929  DATE OF CONSULTATION:  12/20/2011  REFERRING PHYSICIAN:  Aram Beecham, MD CONSULTING PHYSICIAN:  Rose Phi. Kemper Durie, MD  HISTORY: Kelly Gomez is an 81 year old white patient of Dr. Yates Decamp with history of hyperlipidemia, degenerative joint disease, advanced organic dementia, and recurrent urinary tract infections. She was admitted 12/19/2011 and is referred for evaluation of seizures. History comes from the patient's son, Kelly Gomez, office notes from Dr. Dan Humphreys from the [facility] computer, her present hospital chart, and from her hospital records.   The patient was brought to the Emergency Room at 11:15 a.m. on 12/19/2011 by EMTs summoned by her son with concern about seizure and with a suspicion that she had recurrence of a bladder infection. The patient's son reports witnessing an episode in late morning, equal to those which he had weakness in the past, when she was seen in the Emergency Room 08/16/2009, and spells for which she was admitted to [facility] 09/29/2010, 08/06/2011, and 10/27/2011, when she was found to have a urinary tract infection. He reports that in late morning on 12/19/2011, he observed the patient to have a jerk of head and upper body with eyes rolled back for 3 to 5 seconds, then another 3 to 4 minutes later, then a third, then a fourth, after which she laid with eyes closed and had bubbling respirations, was unresponsive. Between spells her eyes could be closed or could be open with blank stare.   With episodes in the past, the patient has remained poorly responsive for the rest of the day but back to her usual baseline the next morning.   In the Emergency Room on 12/19/2011, the patient reportedly had further spells of jerking with eyes rolled back. On arrival to the hospital ward at 5:35 p.m., her nurse noted that she was witnessed to have a seizure lasting one minute. The patient was then  loaded with 1000 mg intravenous Cerebyx and was started on Keppra 500 mg every 12 hours. She has had no further spells in the hospital. When she was seen on the morning of 12/20/2011, her son reported that the patient was still poorly responsive. Urinalysis is consistent with infection with 3+ bacteria, 3000 white blood cells, and 3+ leukocyte esterase. Urine cultures are showing greater than 100,000 colonies of gram-positive rods. Blood cultures are thus far negative for growth. Brain MRI scan is pending for later today.   PHYSICAL EXAMINATION: The patient is a well-developed, well-nourished elderly white woman who was examined lying semisupine, in no apparent distress, with blood pressure 138/80 and heart rate 88. There was no fever. She was normocephalic without evidence of trauma and her neck was supple to passive movements. She was found lying with eyes closed without response to loud address or shaking.  She was not responsive to following commands and had no speech. There was symmetric tone in all four extremities, although mildly decreased throughout.   IMPRESSION:  1. History of advanced organic dementia beginning approximately six years prior to admission, with described picture most suggestive of dementia secondary to Alzheimer's disease.  2. History of recurrent urinary tract infections.  3. Spells consistent with seizures that are associated with urinary tract infections.   RECOMMENDATIONS:  1. I agree with her present work-up and treatment in the hospital including laboratory studies and initiation of antiseizure treatment.  2. Continue on Keppra 500 mg twice a day long term.  3. I do not see  indication for lumbar puncture.   I appreciate being asked to see this interesting lady. ____________________________ Rose PhiPeter R. Kemper Durielarke, MD prc:slb D: 12/20/2011 17:04:54 ET T: 12/21/2011 08:50:18 ET JOB#: [id number]  cc: Rose PhiPeter R. Kemper Durielarke, MD, <Dictator> Gaspar GarbePETER R Shanan Mcmiller MD ELECTRONICALLY SIGNED  01/11/2012 20:00

## 2014-11-10 NOTE — Discharge Summary (Signed)
PATIENT NAME:  Kelly Gomez, Kelly Gomez MR#:  [id number] DATE OF BIRTH:  10-14-1928  DATE OF ADMISSION:  10/27/2011 DATE OF DISCHARGE:  10/31/2011  ADMITTING PHYSICIAN:  Dr. Enid Baas  DISCHARGING PHYSICIAN:  Dr. Enid Baas   CONSULTATIONS IN THE HOSPITAL:  ID consultation by Dr. Orson Aloe    DISCHARGE DIAGNOSES:  1. Enterococcus urinary tract infection.  2. Systemic inflammatory response syndrome.  3. Contaminated blood cultures with coagulase-negative staph.  4. Advanced dementia.  5. Metabolic encephalopathy on admission.   DISCHARGE MEDICATIONS: Doxycycline 100 mg p.o. b.i.d. until 11/06/2011.   DISCHARGE DIET: Regular diet.   DISCHARGE ACTIVITY: As tolerated.    FOLLOWUP INSTRUCTIONS: Patient will follow up in 1 to 2 weeks.   LABS AND IMAGING STUDIES:  WBC 9.5, hemoglobin 12.3, hematocrit 36.6, platelet count 311.  Sodium 143, potassium 3.8, chloride 109, bicarbonate 27, BUN 15, creatinine 0.85, glucose 84. Plasma ammonia is slightly elevated at 38. CT of the head without contrast showing no acute intracranial process, cerebral atrophy and chronic microvascular ischemic changes. Chest x-ray: Clear lung fields. No acute changes. Heart is upper size of normal limits. ALT 12, AST 21, alkaline phosphatase 90, total bili 0.3, albumin 3.1. Blood cultures on admission showing Staph epidermidis and Staph hominis?, which are possibly contaminants. Repeat blood cultures on 10/30/2011 are negative for more than 48 hours. Urinalysis positive for infection. Urine culture is growing pansensitive enterococcus faecalis.   BRIEF HOSPITAL COURSE: Kelly Gomez is an 81 year old elderly Caucasian female with history of significant dementia who lives at home being cared for by her son and was brought in secondary to change in mental status and also myoclonic jerks.  The patient has had prior admissions just like this in 07/2011 and 09/2010 when she was found Escherichia coli urinary tract  infection. She was admitted for systemic inflammatory response syndrome with fever and leukocytosis possibly secondary to urinary tract infection.  1. SIRS secondary to enterococcus urinary tract infection at this time and not Escherichia coli. Blood cultures remained negative other than the contaminant coagulase-negative staph. Urine culture is growing sensitive enterococcus. She was on vancomycin while in the hospital until blood culture ID and sensitivities were back. At the time of discharge since the blood cultures are contaminated she is being changed over to doxycycline to cover for her enterococcus for a total course of 10 days. 2. Metabolic encephalopathy with myoclonic jerks, possibly secondary to underlying infection/urinary tract infection. For similar presentation in March 2012 a complete neurological work-up including MRI, EEG, and spinal tap were done and were found to be negative. However, myoclonic jerks improved by the time of discharge when she was on antibiotics and her mental status is back to baseline. She does not even recognize family and plays with her baby doll most of the time.   Her course has been otherwise uneventful in the hospital.  She is not on any other medications at home. She usually tends to spit her medications out so the family was advised to crush her doxycycline and give it with applesauce.   DISCHARGE CONDITION: Stable.   DISCHARGE DISPOSITION: Home.   TIME SPENT ON DISCHARGE: 45 minutes.   The patient does not have a primary care physician at this time.  She used to see Dr. Yates Decamp several years ago and was supposed to follow up with Dr. Ronna Polio, which they have not done so far yet.   ____________________________ Enid Baas, MD rk:bjt D: 11/01/2011 15:34:11 ET T: 11/02/2011 13:31:05 ET  JOB#: [id number]  cc: Enid Baasadhika Erlinda Solinger, MD, <Dictator> Enid BaasADHIKA Riata Ikeda MD ELECTRONICALLY SIGNED 11/02/2011 15:06

## 2014-11-10 NOTE — H&P (Signed)
PATIENT NAME:  Kelly SenegalICHOLSON, Jamecia L MR#:  [id number] DATE OF BIRTH:  24-Aug-1928  DATE OF ADMISSION:  12/19/2011  REFERRING PHYSICIAN: Maricela BoLuna Ragsdale, MD  FAMILY PHYSICIAN: None.   REASON FOR ADMISSION: Metabolic encephalopathy with failure to thrive and recurrent urinary tract infection.   HISTORY OF PRESENT ILLNESS: The patient is an 81 year old female with a history of progressive dementia who was admitted approximately two months ago with enterococcus urinary tract infection with SIRS and metabolic encephalopathy. She lives at home with her son and daughter. She presents to the emergency room today with worsening of her mental status with increasing lethargy and confusion. She is not eating and not ambulating. In the emergency room, the patient was noted to have a recurrent urinary tract infection and was subsequently admitted for further evaluation.   PAST MEDICAL HISTORY:  1. Advanced dementia.  2. History of pneumothorax.  3. History of SIRS due to enterococcus urinary tract infection.   MEDICATIONS: None.   ALLERGIES: Penicillin.   SOCIAL HISTORY: Negative for alcohol or tobacco abuse.   FAMILY HISTORY: Noncontributory.   REVIEW OF SYSTEMS: Unable to obtain from the patient.   PHYSICAL EXAMINATION:   GENERAL: The patient is not verbally responsive.   VITAL SIGNS: Vital signs are remarkable for a blood pressure of 148/67 with a heart rate of 83 and a respiratory rate of 18. She is afebrile.   HEENT: Normocephalic, atraumatic. Pupils are equally round and reactive to light and accommodation. Extraocular movements are intact. Sclerae are anicteric. Conjunctivae are clear. Oropharynx is dry but clear.   NECK: Supple without jugular venous distention. No adenopathy or thyromegaly is noted.   LUNGS: Essentially clear with no wheezes, rales, or rhonchi.   HEART: Rapid rate with an irregular rhythm. Normal S1 and S2. No significant rubs or gallops.   ABDOMEN: Soft and nontender with  normal active bowel sounds. No organomegaly or masses were appreciated. No hernias or bruits were noted.   EXTREMITIES: No clubbing, cyanosis, or edema. Pulses were 1+ bilaterally.   SKIN: Warm and dry without rash or lesions.   NEUROLOGIC: Cranial nerves II through XII grossly intact. Deep tendon reflexes were symmetric. Motor and sensory examination is nonfocal.   PSYCH: Exam revealed a patient who is lethargic and not responsive to verbal questions.   LABS/STUDIES: Urinalysis revealed 3+ bacteria with 3636 WBCs per high-power field with 3+ leukocyte esterase.   White count was 9.4 with a hemoglobin of 11.6. Glucose was 104 with a BUN of 16 and a creatinine of 0.92 with a sodium of 144 and a potassium of 4.4. GFR was 58.   ASSESSMENT:  1. Failure to thrive.  2. Metabolic encephalopathy.  3. Recurrent urinary tract infection.  4. Advanced dementia.  5. Anemia of chronic disease.   PLAN:  1. The patient will be admitted to the floor with IV fluids and IV antibiotics. We will send off blood and urine cultures from the emergency room.  2. We will consult speech therapy, physical therapy, and the case manager for placement.  3. Follow-up routine labs in the morning. Further treatment and evaluation will depend upon the patient's progress.   TOTAL TIME SPENT:  50 minutes.  ____________________________ Duane LopeJeffrey D. Judithann SheenSparks, MD jds:slb D: 12/19/2011 15:36:58 ET T: 12/20/2011 07:35:20 ET JOB#: [id number]  cc: Duane LopeJeffrey D. Judithann SheenSparks, MD, <Dictator> Mellisa Arshad Rodena Medin Milani Lowenstein MD ELECTRONICALLY SIGNED 12/20/2011 14:56

## 2014-11-10 NOTE — Consult Note (Signed)
Impression: 81yo WF w/ h/o dementia and E. coli UTI admitted with probable Enterococcal UTI and GPC bacteremia.  I reviewed the plates in the micro lab.  Her urine culture appears to be Enterococcus.  Her blood cultures have not yet been identified.  During her prior hospitalization, she had CNS in the blood.  This could still be a contaminant, but if her BCx grow Enterococcus, then it is likely a true infection. Continue vanco until the sensitivities return. Will stop the levofloxacin. No evidence for sepsis as her BP is stable.  Electronic Signatures: Masayoshi Couzens, Rosalyn GessMichael E (MD)  (Signed on 11-Apr-13 14:54)  Authored  Last Updated: 11-Apr-13 14:54 by Kierrah Kilbride, Rosalyn GessMichael E (MD)
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
